# Patient Record
Sex: Male | Born: 1960 | Race: White | Hispanic: No | Marital: Married | Smoking: Never smoker
Health system: Southern US, Community
[De-identification: ages and names within clinical notes are randomized; demographics above are authoritative.]

## PROBLEM LIST (undated history)

## (undated) DIAGNOSIS — S3609XA Other injury of spleen, initial encounter: Secondary | ICD-10-CM

## (undated) DIAGNOSIS — I10 Essential (primary) hypertension: Secondary | ICD-10-CM

## (undated) HISTORY — PX: OTHER SURGICAL HISTORY: SHX169

---

## 2009-02-12 ENCOUNTER — Ambulatory Visit: Payer: Self-pay | Admitting: Unknown Physician Specialty

## 2010-01-15 ENCOUNTER — Ambulatory Visit: Payer: Self-pay | Admitting: Internal Medicine

## 2010-01-26 ENCOUNTER — Ambulatory Visit: Payer: Self-pay | Admitting: Family Medicine

## 2012-02-19 ENCOUNTER — Ambulatory Visit: Payer: Self-pay | Admitting: Otolaryngology

## 2012-02-19 LAB — CBC
HCT: 44.9 % (ref 40.0–52.0)
MCH: 30.4 pg (ref 26.0–34.0)
MCHC: 35.1 g/dL (ref 32.0–36.0)
Platelet: 118 10*3/uL — ABNORMAL LOW (ref 150–440)
RBC: 5.19 10*6/uL (ref 4.40–5.90)
RDW: 14.2 % (ref 11.5–14.5)
WBC: 6.1 10*3/uL (ref 3.8–10.6)

## 2012-02-20 LAB — PATHOLOGY REPORT

## 2013-03-07 ENCOUNTER — Emergency Department: Payer: Self-pay | Admitting: Emergency Medicine

## 2013-03-07 LAB — COMPREHENSIVE METABOLIC PANEL
ALBUMIN: 3.9 g/dL (ref 3.4–5.0)
ALT: 39 U/L (ref 12–78)
ANION GAP: 5 — AB (ref 7–16)
AST: 24 U/L (ref 15–37)
Alkaline Phosphatase: 94 U/L
BILIRUBIN TOTAL: 0.3 mg/dL (ref 0.2–1.0)
BUN: 21 mg/dL — ABNORMAL HIGH (ref 7–18)
CALCIUM: 9.1 mg/dL (ref 8.5–10.1)
CHLORIDE: 106 mmol/L (ref 98–107)
CO2: 28 mmol/L (ref 21–32)
CREATININE: 0.99 mg/dL (ref 0.60–1.30)
EGFR (African American): >60
Glucose: 108 mg/dL — ABNORMAL HIGH (ref 65–99)
OSMOLALITY: 281 (ref 275–301)
Potassium: 4.1 mmol/L (ref 3.5–5.1)
SODIUM: 139 mmol/L (ref 136–145)
TOTAL PROTEIN: 7.5 g/dL (ref 6.4–8.2)

## 2013-03-07 LAB — CBC
HCT: 43.9 % (ref 40.0–52.0)
HGB: 15.5 g/dL (ref 13.0–18.0)
MCH: 30.8 pg (ref 26.0–34.0)
MCHC: 35.3 g/dL (ref 32.0–36.0)
MCV: 87 fL (ref 80–100)
Platelet: 114 10*3/uL — ABNORMAL LOW (ref 150–440)
RBC: 5.04 10*6/uL (ref 4.40–5.90)
RDW: 13.8 % (ref 11.5–14.5)
WBC: 6.8 10*3/uL (ref 3.8–10.6)

## 2014-06-16 NOTE — Op Note (Signed)
PATIENT NAME:  Sean DitchMOORE, [name] MR#:  [id number] DATE OF BIRTH:  September 05, 1960  DATE OF PROCEDURE:  02/19/2012  PREOPERATIVE DIAGNOSIS: Bilateral vocal cord polyps.  POSTOPERATIVE DIAGNOSIS: Bilateral vocal cord polyps.   OPERATIVE PROCEDURE: Direct microlaryngoscopy and excision of right vocal cord polyp.   SURGEON: Vernie MurdersPaul Conal Shetley, M.D.   ANESTHESIA: General.  COMPLICATIONS: None.  TOTAL ESTIMATED BLOOD LOSS: Minimal.   DESCRIPTION OF PROCEDURE: The patient was given general anesthesia by oral endotracheal intubation using a small endotracheal tube. A Dedo laryngoscope was used first for visualization of the hypopharynx and larynx. A tooth guard was placed. The angulation was such that I could not see his vocal cords very well. A Hollinger anterior commissure scope was then used to visualize the vocal cords. I could see the cords well. You could see the polyps and a papilloma on the right anterior cord. The Louie arm was brought in for stabilization and then the microscope was brought in for high-power magnification. Endoscopes were used for visualizing and photographing.    The right side had the anterior papilloma and watery vocal cord polyp that could be seen on the pictures. This was grasped with a micro up-biting forceps. The papilloma and polyp were removed and sent to pathology for specimen. There was very minimal bleeding. The muscle was left intact. Phenylephrine 1 mL was used to soak down a cottonoid pledget to help with        vasoconstriction. There were no operative complications. The area was visualized well after. He was then awakened, extubated and taken to the recovery room in satisfactory condition. There were no operative complications. ____________________________ Cammy CopaPaul H. Vertie Dibbern, MD phj:sb D: 02/19/2012 08:40:18 ET T: 02/19/2012 11:19:20 ET JOB#: [id number]  cc: Cammy CopaPaul H. Arael Piccione, MD, <Dictator> Cammy CopaPAUL H Rozanne Heumann MD ELECTRONICALLY SIGNED 02/19/2012 19:42
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
## 2015-04-11 ENCOUNTER — Ambulatory Visit
Admission: EM | Admit: 2015-04-11 | Discharge: 2015-04-11 | Disposition: A | Discharge status: Home / Self Care | Payer: BLUE CROSS/BLUE SHIELD | Attending: Family Medicine | Admitting: Family Medicine

## 2015-04-11 DIAGNOSIS — J0100 Acute maxillary sinusitis, unspecified: Secondary | ICD-10-CM | POA: Diagnosis not present

## 2015-04-11 HISTORY — DX: Other injury of spleen, initial encounter: S36.09XA

## 2015-04-11 MED ORDER — AMOXICILLIN 875 MG PO TABS: 875.0000 mg | ORAL_TABLET | Freq: Two times a day (BID) | ORAL | Status: DC

## 2015-04-11 MED ORDER — GUAIFENESIN-CODEINE 100-10 MG/5ML PO SOLN: ORAL | Status: DC

## 2015-04-11 NOTE — ED Provider Notes (Addendum)
CSN: [id number]     Arrival date & time 04/11/15  4098 History   First MD Initiated Contact with Patient 04/11/15 [phone]     Chief Complaint  Patient presents with  . URI   (Consider location/radiation/quality/duration/timing/severity/associated sxs/prior Treatment) Patient is a 55 y.o. male presenting with URI. The history is provided by the patient.  URI Presenting symptoms: congestion, facial pain, fatigue and rhinorrhea   Severity:  Moderate Onset quality:  Sudden Duration:  1 week Timing:  Constant Progression:  Worsening Chronicity:  New Relieved by:  Nothing Ineffective treatments:  OTC medications Associated symptoms: headaches and sinus pain   Associated symptoms: no wheezing   Risk factors: sick contacts   Risk factors: not elderly, no chronic cardiac disease, no chronic kidney disease, no chronic respiratory disease, no diabetes mellitus, no immunosuppression, no recent illness and no recent travel     Past Medical History  Diagnosis Date  . Ruptured spleen    Past Surgical History  Procedure Laterality Date  . Spleen surgery     History reviewed. No pertinent family history. Social History  Substance Use Topics  . Smoking status: Never Smoker   . Smokeless tobacco: Never Used  . Alcohol Use: No    Review of Systems  Constitutional: Positive for fatigue.  HENT: Positive for congestion and rhinorrhea.   Respiratory: Negative for wheezing.   Neurological: Positive for headaches.    Allergies  Aspirin  Home Medications   Prior to Admission medications   Medication Sig Start Date End Date Taking? Authorizing Provider  amoxicillin (AMOXIL) 875 MG tablet Take 1 tablet (875 mg total) by mouth 2 (two) times daily. 04/11/15   Payton Mccallum, MD  esomeprazole (NEXIUM) 20 MG capsule Take 20 mg by mouth daily at 12 noon.    Historical Provider, MD  guaiFENesin-codeine 100-10 MG/5ML syrup 10 ml po qhs prn cough 04/11/15   Payton Mccallum, MD   Meds Ordered and  Administered this Visit  Medications - No data to display  BP 126/94 mmHg  Pulse 82  Temp(Src) 98.9 F (37.2 C) (Oral)  Resp 18  Ht  (1.778 m)  Wt 245 lb (111.131 kg)  BMI 35.15 kg/m2  SpO2 97% No data found.   Physical Exam  Constitutional: He appears well-developed and well-nourished. No distress.  HENT:  Head: Normocephalic and atraumatic.  Right Ear: Tympanic membrane, external ear and ear canal normal.  Left Ear: Tympanic membrane, external ear and ear canal normal.  Nose: Mucosal edema and rhinorrhea present. Right sinus exhibits maxillary sinus tenderness and frontal sinus tenderness. Left sinus exhibits maxillary sinus tenderness and frontal sinus tenderness.  Mouth/Throat: Uvula is midline, oropharynx is clear and moist and mucous membranes are normal. No oropharyngeal exudate or tonsillar abscesses.  Eyes: Conjunctivae and EOM are normal. Pupils are equal, round, and reactive to light. Right eye exhibits no discharge. Left eye exhibits no discharge. No scleral icterus.  Neck: Normal range of motion. Neck supple. No tracheal deviation present. No thyromegaly present.  Cardiovascular: Normal rate, regular rhythm and normal heart sounds.   Pulmonary/Chest: Effort normal and breath sounds normal. No stridor. No respiratory distress. He has no wheezes. He has no rales. He exhibits no tenderness.  Lymphadenopathy:    He has no cervical adenopathy.  Neurological: He is alert.  Skin: Skin is warm and dry. No rash noted. He is not diaphoretic.  Nursing note and vitals [id number] Course  Procedures (including critical care time)  Labs Review  Labs Reviewed - No data to display  Imaging Review No results found.   Visual Acuity Review  Right Eye Distance:   Left Eye Distance:   Bilateral Distance:    Right Eye Near:   Left Eye Near:    Bilateral Near:         MDM   1. Acute maxillary sinusitis, recurrence not specified    Discharge Medication List  as of 04/11/2015 10:01 AM    START taking these medications   Details  amoxicillin (AMOXIL) 875 MG tablet Take 1 tablet (875 mg total) by mouth 2 (two) times daily., Starting 04/11/2015, Until Discontinued, Normal    guaiFENesin-codeine 100-10 MG/5ML syrup 10 ml po qhs prn cough, Print       Discharge Medication List as of 04/11/2015 10:01 AM    START taking these medications   Details  amoxicillin (AMOXIL) 875 MG tablet Take 1 tablet (875 mg total) by mouth 2 (two) times daily., Starting 04/11/2015, Until Discontinued, Normal    guaiFENesin-codeine 100-10 MG/5ML syrup 10 ml po qhs prn cough, Print       1. diagnosis reviewed with patient 2. rx as per orders above; reviewed possible side effects, interactions, risks and benefits  3. Recommend supportive treatment with otc flonase  4. Follow-up prn if symptoms worsen or don't improve    Payton Mccallum, MD 04/11/15 1008  Payton Mccallum, MD 04/11/15 1009

## 2015-04-11 NOTE — ED Notes (Signed)
Patient c/o coughing, nasal congestion, body aches, and chills since Tuesday.  No sore throat.  Denies fever/n/v or chest pain.

## 2021-01-25 ENCOUNTER — Ambulatory Visit: Payer: Self-pay | Admitting: Urology

## 2021-02-08 ENCOUNTER — Encounter: Payer: Self-pay | Admitting: Urology

## 2021-02-08 ENCOUNTER — Ambulatory Visit (INDEPENDENT_AMBULATORY_CARE_PROVIDER_SITE_OTHER): Payer: No Typology Code available for payment source | Admitting: Urology

## 2021-02-08 ENCOUNTER — Other Ambulatory Visit: Payer: Self-pay

## 2021-02-08 ENCOUNTER — Ambulatory Visit: Payer: Self-pay | Admitting: Urology

## 2021-02-08 VITALS — BP 143/89 | HR 80 | Ht 70.0 in | Wt 243.0 lb

## 2021-02-08 DIAGNOSIS — Z8042 Family history of malignant neoplasm of prostate: Secondary | ICD-10-CM | POA: Diagnosis not present

## 2021-02-08 DIAGNOSIS — Z125 Encounter for screening for malignant neoplasm of prostate: Secondary | ICD-10-CM

## 2021-02-08 NOTE — Patient Instructions (Signed)
Prostate Cancer Screening Prostate cancer screening is testing that is done to check for the presence of prostate cancer in men. The prostate gland is a walnut-sized gland that is located below the bladder and in front of the rectum in males. The function of the prostate is to add fluid to semen during ejaculation. Prostate cancer is one of the most common types of cancer in men. Who should have prostate cancer screening? Screening recommendations vary based on age and other risk factors, as well as between the professional organizations who make the recommendations. In general, screening is recommended if: You are age 50 to 70 and have an average risk for prostate cancer. You should talk with your health care provider about your need for screening and how often screening should be done. Because most prostate cancers are slow growing and will not cause death, screening in this age group is generally reserved for men who have a 10- to 15-year life expectancy. You are younger than age 50, and you have these risk factors: Having a father, brother, or uncle who has been diagnosed with prostate cancer. The risk is higher if your family member's cancer occurred at an early age or if you have multiple family members with prostate cancer at an early age. Being a male who is Black or is of Caribbean or sub-Saharan African descent. In general, screening is not recommended if: You are younger than age 40. You are between the ages of 40 and 49 and you have no risk factors. You are 70 years of age or older. At this age, the risks that screening can cause are greater than the benefits that it may provide. If you are at high risk for prostate cancer, your health care provider may recommend that you have screenings more often or that you start screening at a younger age. How is screening for prostate cancer done? The recommended prostate cancer screening test is a blood test called the prostate-specific antigen (PSA)  test. PSA is a protein that is made in the prostate. As you age, your prostate naturally produces more PSA. Abnormally high PSA levels may be caused by: Prostate cancer. An enlarged prostate that is not caused by cancer (benign prostatic hyperplasia, or BPH). This condition is very common in older men. A prostate gland infection (prostatitis) or urinary tract infection. Certain medicines such as male hormones (like testosterone) or other medicines that raise testosterone levels. A rectal exam may be done as part of prostate cancer screening to help provide information about the size of your prostate gland. When a rectal exam is performed, it should be done after the PSA level is drawn to avoid any effect on the results. Depending on the PSA results, you may need more tests, such as: A physical exam to check the size of your prostate gland, if not done as part of screening. Blood and imaging tests. A procedure to remove tissue samples from your prostate gland for testing (biopsy). This is the only way to know for certain if you have prostate cancer. What are the benefits of prostate cancer screening? Screening can help to identify cancer at an early stage, before symptoms start and when the cancer can be treated more easily. There is a small chance that screening may lower your risk of dying from prostate cancer. The chance is small because prostate cancer is a slow-growing cancer, and most men with prostate cancer die from a different cause. What are the risks of prostate cancer screening? The main   risk of prostate cancer screening is diagnosing and treating prostate cancer that would never have caused any symptoms or problems. This is called overdiagnosisand overtreatment. PSA screening cannot tell you if your PSA is high due to cancer or a different cause. A prostate biopsy is the only procedure to diagnose prostate cancer. Even the results of a biopsy may not tell you if your cancer needs to be  treated. Slow-growing prostate cancer may not need any treatment other than monitoring, so diagnosing and treating it may cause unnecessary stress or other side effects. Questions to ask your health care provider When should I start prostate cancer screening? What is my risk for prostate cancer? How often do I need screening? What type of screening tests do I need? How do I get my test results? What do my results mean? Do I need treatment? Where to find more information The American Cancer Society: www.cancer.org American Urological Association: www.auanet.org Contact a health care provider if: You have difficulty urinating. You have pain when you urinate or ejaculate. You have blood in your urine or semen. You have pain in your back or in the area of your prostate. Summary Prostate cancer is a common type of cancer in men. The prostate gland is located below the bladder and in front of the rectum. This gland adds fluid to semen during ejaculation. Prostate cancer screening may identify cancer at an early stage, when the cancer can be treated more easily and is less likely to have spread to other areas of the body. The prostate-specific antigen (PSA) test is the recommended screening test for prostate cancer, but it has associated risks. Discuss the risks and benefits of prostate cancer screening with your health care provider. If you are age 70 or older, the risks that screening can cause are greater than the benefits that it may provide. This information is not intended to replace advice given to you by your health care provider. Make sure you discuss any questions you have with your health care provider. Document Revised: 08/09/2020 Document Reviewed: 08/09/2020 Elsevier Patient Education  2022 Elsevier Inc.   Prostate-Specific Antigen Test Why am I having this test? The prostate-specific antigen (PSA) test is a screening test for prostate cancer. It can identify early signs of  prostate cancer, which may allow for early detection and more effective treatment. Your health care provider may recommend that you have a PSA test starting at age 50 or that you have one earlier if you are at higher risk for prostate cancer. You may also have a PSA test: To monitor treatment of prostate cancer. To check whether prostate cancer has returned after treatment. What is being tested? This test measures the amount of PSA in your blood. PSA is a protein that is made in the prostate. The prostate naturally produces more PSA as you age, but very high levels may be a sign of a medical condition. What kind of sample is taken? A blood sample is required for this test. It is usually collected by inserting a needle into a blood vessel but can also be collected by sticking a finger with a small needle. Blood for this test should be drawn before having an exam of the prostate that involves digital rectal examination to avoid affecting the results. How do I prepare for this test? Do not ejaculate starting 24 hours before your test, or as long as told by your health care provider, as this can cause an elevation in PSA. Do not undergo any   procedures that require manipulation of the prostate, such as biopsy or surgery, for 6 weeks before the test is done as this can cause an elevation in PSA. Tell a health care provider about: Any signs you may have of other conditions that can affect PSA levels, such as: An enlarged prostate that is not caused by cancer (benign prostatic hyperplasia, or BPH). This condition is very common in older men. A prostate or urinary tract infection. Any allergies you have. All medicines you are taking, including vitamins, herbs, eye drops, creams, and over-the-counter medicines. This also includes: Medicines to assist with hair growth, such as finasteride. Any recent exposure to a medicine called diethylstilbestrol (DES). Medicines such as male hormones (like testosterone) or  other medicines that raise testosterone levels. Any bleeding problems you have. Any recent procedures you have had, especially any procedures involving the prostate or rectum. Any medical conditions you have. How are the results reported? Your test results will be reported as a value that indicates how much PSA is in your blood. This will be given as nanograms of PSA per milliliter of blood (ng/mL). Your health care provider will compare your results to normal ranges that were established after testing a large group of people (reference ranges). Reference ranges may vary among labs and hospitals. PSA levels vary from person to person and generally increase with age. Because of this variation, there is no single PSA value that is considered normal for everyone. Instead, PSA reference ranges are used to describe whether your PSA levels are considered low or high (elevated). Common reference ranges are: Low: 0-2.5 ng/mL. Slightly to moderately elevated: 2.6-10.0 ng/mL. Moderately elevated: 10.0-19.9 ng/mL. Significantly elevated: 20 ng/mL or greater. What do the results mean? A test result that is higher than 4 ng/mL may mean that you have prostate cancer. However, a PSA test by itself is not enough to diagnose prostate cancer. High PSA levels may also be caused by the natural aging process, prostate infection (prostatitis), or BPH. PSA screening cannot tell you if your PSA is high due to cancer or a different cause. A prostate biopsy is the only way to diagnose prostate cancer. A risk of having the PSA test is diagnosing and treating prostate cancer that would never have caused any symptoms or problems (overdiagnosis and overtreatment). Talk with your health care provider about what your results mean. In some cases, your health care provider may do more testing to confirm the results. Questions to ask your health care provider Ask your health care provider, or the department that is doing the  test: When will my results be ready? How will I get my results? What are my treatment options? What other tests do I need? What are my next steps? Summary The prostate-specific antigen (PSA) test is a screening test for prostate cancer. Your health care provider may recommend that you have a PSA test starting at age 50 or that you have one earlier if you are at higher risk for prostate cancer. A test result that is higher than 4 ng/mL may mean that you have prostate cancer. However, elevated levels can be caused by a number of conditions other than prostate cancer. Talk with your health care provider about what your results mean. This information is not intended to replace advice given to you by your health care provider. Make sure you discuss any questions you have with your health care provider. Document Revised: 06/23/2020 Document Reviewed: 06/23/2020 Elsevier Patient Education  2022 Elsevier Inc.  

## 2021-02-08 NOTE — Progress Notes (Signed)
° °  02/08/21 4:04 PM   Sean Holmes 05/22/1960 [id number]  CC: PSA screening, family history of prostate cancer  HPI: 60 year old male here with his wife today with questions about PSA screening.  He has a family history of prostate cancer in his father, and multiple uncles.  He has had PSA screening since at least 2012, when PSA was 2.3.  It has ranged from 2-3.7 since that time, most recently stable at 2.9 to in September 2022.  He was having some flank pain at that time and a CT was performed in the [facility] system which was essentially benign aside from a mildly enlarged prostate.  He denies any gross hematuria or urinary symptoms.  He does not take any prostate medications.   PMH: Past Medical History:  Diagnosis Date   Ruptured spleen     Surgical History: Past Surgical History:  Procedure Laterality Date   Spleen Surgery        Family History: No family history on file.  Social History:  reports that he has never smoked. He has never used smokeless tobacco. He reports that he does not drink alcohol and does not use drugs.  Physical Exam: BP (!) 143/89    Pulse 80    Ht 5\' 10"  (1.778 m)    Wt 243 lb (110.2 kg)    BMI 34.87 kg/m    Constitutional:  Alert and oriented, No acute distress. Cardiovascular: No clubbing, cyanosis, or edema. Respiratory: Normal respiratory effort, no increased work of breathing. 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  We reviewed the controversies surrounding PSA screening, and the AUA guideline recommendations.  In general, a  man's PSA increases with age and is produced by both normal and cancerous prostate tissue. The differential diagnosis for elevated PSA includes BPH, prostate cancer, infection, recent intercourse/ejaculation, recent urethroscopic manipulation (foley placement/cystoscopy) or trauma, and prostatitis.   His PSA history is very reassuring and has been stable over the last 10 years, and within the normal range.  DRE is benign today.  He would like to continue yearly PSA screening with PCP, return precautions discussed   I spent 45 total minutes on the day of the encounter including pre-visit review of the medical record, face-to-face time with the patient, and post visit ordering of labs/imaging/tests.  76, MD 02/08/2021  [facility] [address] [place], [place] [phone]

## 2023-09-22 ENCOUNTER — Ambulatory Visit
Admission: EM | Admit: 2023-09-22 | Discharge: 2023-09-22 | Disposition: A | Discharge status: Home / Self Care | Attending: Emergency Medicine | Admitting: Emergency Medicine

## 2023-09-22 ENCOUNTER — Encounter: Payer: Self-pay | Admitting: Emergency Medicine

## 2023-09-22 DIAGNOSIS — L03113 Cellulitis of right upper limb: Secondary | ICD-10-CM | POA: Diagnosis not present

## 2023-09-22 HISTORY — DX: Essential (primary) hypertension: I10

## 2023-09-22 MED ORDER — DOXYCYCLINE HYCLATE 100 MG PO CAPS: 100.0000 mg | ORAL_CAPSULE | Freq: Two times a day (BID) | ORAL | 0 refills | Status: AC

## 2023-09-22 NOTE — ED Triage Notes (Signed)
 Patient reports he got an insect bite to his right inner upper arm on Thursday.  Patient reports redness, swelling and tenderness at the site.

## 2023-09-22 NOTE — ED Provider Notes (Signed)
 [facility]    CSN: [id number] Arrival date & time: 09/22/23  0806      History   Chief Complaint Chief Complaint  Patient presents with   Insect Bite    HPI Sean Holmes is a 63 y.o. male.   HPI  63 year old male with past medical history significant for hypertension and ruptured spleen status post splenectomy presents for evaluation of redness and swelling to his right upper arm.  He was stung by wasp 2 days ago.  Last night the area was more red, swollen, and hot to touch.  Patient has not run a fever.  This morning the redness has receded a bit but is still present.  Past Medical History:  Diagnosis Date   Hypertension    Ruptured spleen     There are no active problems to display for this patient.   Past Surgical History:  Procedure Laterality Date   Spleen Surgery         Home Medications    Prior to Admission medications   Medication Sig Start Date End Date Taking? Authorizing Provider  doxycycline  (VIBRAMYCIN ) 100 MG capsule Take 1 capsule (100 mg total) by mouth 2 (two) times daily for 7 days. 09/22/23 09/29/23 Yes Bernardino Ditch, NP  calcium carbonate (TUMS EX) 750 MG chewable tablet Chew by mouth.    [provider]  esomeprazole (NEXIUM) 20 MG capsule Take 20 mg by mouth daily at 12 noon.    [provider]  losartan (COZAAR) 50 MG tablet Take 1 tablet by mouth daily. 11/26/20 11/26/21  [provider]    Family History History reviewed. No pertinent family history.  Social History Social History   Tobacco Use   Smoking status: Never   Smokeless tobacco: Never  Vaping Use   Vaping status: Never Used  Substance Use Topics   Alcohol use: No   Drug use: Never     Allergies   Aspirin and Naproxen sodium   Review of Systems Review of Systems  Constitutional:  Negative for fever.  Skin:  Positive for color change and wound.     Physical Exam Triage Vital Signs ED Triage Vitals  Encounter Vitals  Group     BP      Girls Systolic BP Percentile      Girls Diastolic BP Percentile      Boys Systolic BP Percentile      Boys Diastolic BP Percentile      Pulse      Resp      Temp      Temp src      SpO2      Weight      Height      Head Circumference      Peak Flow      Pain Score      Pain Loc      Pain Education      Exclude from Growth Chart    No data found.  Updated Vital Signs BP (!) 155/95 (BP Location: Left Arm)   Pulse 64   Temp 98 F (36.7 C) (Oral)   Resp 15   Ht 5' 10 (1.778 m)   Wt 242 lb 15.2 oz (110.2 kg)   SpO2 95%   BMI 34.86 kg/m   Visual Acuity Right Eye Distance:   Left Eye Distance:   Bilateral Distance:    Right Eye Near:   Left Eye Near:    Bilateral Near:  Physical Exam Vitals and nursing note reviewed.  Constitutional:      Appearance: Normal appearance. He is not ill-appearing.  Skin:    General: Skin is warm and dry.     Capillary Refill: Capillary refill takes less than 2 seconds.     Findings: Erythema present.  Neurological:     General: No focal deficit present.     Mental Status: He is alert and oriented to person, place, and time.      UC Treatments / Results  Labs (all labs ordered are listed, but only abnormal results are displayed) Labs Reviewed - No data to display  EKG   Radiology No results found.  Procedures Procedures (including critical care time)  Medications Ordered in UC Medications - No data to display  Initial Impression / Assessment and Plan / UC Course  I have reviewed the triage vital signs and the nursing notes.  Pertinent labs & imaging results that were available during my care of the patient were reviewed by me and considered in my medical decision making (see chart for details).   Patient is a pleasant, nontoxic-appearing 63 old male presenting for evaluation of developing cellulitis as result of a wasp sting that occurred 2 days ago.  As you can see image above, there is a  significant area of erythema surrounded by a lighter area of erythema that covers the anterior medial aspect of the right upper arm.  There is a scab at the site of envenomation.  The area is warmer to touch than the surrounding tissue.  No induration or fluctuance.  There is the residual outline drawn by one of the patient's friends last night.  This does show that the redness is receding though I am still concerned that the patient is developing cellulitis.  With his history of splenectomy I do feel a course of antibiotics is in order.  I will discharge him home on doxycycline  100 mg twice daily for 7 days.  I have advised him that the doxycycline  can make him more prone to sunburn so if he is outdoors for any length of time he needs to wear sunscreen and reapply it every 90 minutes.  He is to continue to monitor the area for any increased redness, swelling, pain, warmth, or if he develops a fever.  If these conditions arise he needs to return for reevaluation.   Final Clinical Impressions(s) / UC Diagnoses   Final diagnoses:  Cellulitis of right upper extremity     Discharge Instructions      Take the doxycycline  twice daily with food for 7 days for treatment of your cellulitis.  Be mindful that doxycycline  can make you more photosensitive (prone to sunburn) so if you are outdoors for longer than 15 minutes you need to wear sunscreen with at least a 30 SPF.  You also need to reapply the sunscreen every 90 minutes to prevent sunburn.  You may use over-the-counter Tylenol and/or ibuprofen according the package directions as needed for any pain or fever.  You may use topical Cortizone-10 or topical Benadryl cream as needed for itching.  Follow the package instructions for dosing schedule.  If you develop any increased redness, swelling, pain or warmth at the site, red streaks going up your arm, or a fever you need to return for reevaluation or see your primary care provider.     ED  Prescriptions     Medication Sig Dispense Auth. Provider   doxycycline  (VIBRAMYCIN ) 100 MG capsule Take 1  capsule (100 mg total) by mouth 2 (two) times daily for 7 days. 14 capsule Bernardino Ditch, NP      PDMP not reviewed this encounter.   Bernardino Ditch, NP 09/22/23 [phone]

## 2023-09-22 NOTE — Discharge Instructions (Addendum)
 Take the doxycycline  twice daily with food for 7 days for treatment of your cellulitis.  Be mindful that doxycycline  can make you more photosensitive (prone to sunburn) so if you are outdoors for longer than 15 minutes you need to wear sunscreen with at least a 30 SPF.  You also need to reapply the sunscreen every 90 minutes to prevent sunburn.  You may use over-the-counter Tylenol and/or ibuprofen according the package directions as needed for any pain or fever.  You may use topical Cortizone-10 or topical Benadryl cream as needed for itching.  Follow the package instructions for dosing schedule.  If you develop any increased redness, swelling, pain or warmth at the site, red streaks going up your arm, or a fever you need to return for reevaluation or see your primary care provider.
